# Patient Record
Sex: Male | Born: 1980 | Race: White | Hispanic: No | Marital: Married | Smoking: Never smoker
Health system: Southern US, Community
[De-identification: ages and names within clinical notes are randomized; demographics above are authoritative.]

## PROBLEM LIST (undated history)

## (undated) DIAGNOSIS — I10 Essential (primary) hypertension: Secondary | ICD-10-CM

## (undated) DIAGNOSIS — K219 Gastro-esophageal reflux disease without esophagitis: Secondary | ICD-10-CM

## (undated) HISTORY — DX: Gastro-esophageal reflux disease without esophagitis: K21.9

## (undated) HISTORY — DX: Essential (primary) hypertension: I10

---

## 2020-10-15 DIAGNOSIS — F419 Anxiety disorder, unspecified: Secondary | ICD-10-CM | POA: Diagnosis not present

## 2020-10-21 DIAGNOSIS — F419 Anxiety disorder, unspecified: Secondary | ICD-10-CM | POA: Diagnosis not present

## 2021-07-13 DIAGNOSIS — R52 Pain, unspecified: Secondary | ICD-10-CM | POA: Diagnosis not present

## 2021-07-13 DIAGNOSIS — R42 Dizziness and giddiness: Secondary | ICD-10-CM | POA: Diagnosis not present

## 2021-07-13 DIAGNOSIS — J029 Acute pharyngitis, unspecified: Secondary | ICD-10-CM | POA: Diagnosis not present

## 2021-07-13 DIAGNOSIS — R110 Nausea: Secondary | ICD-10-CM | POA: Diagnosis not present

## 2021-08-02 DIAGNOSIS — R059 Cough, unspecified: Secondary | ICD-10-CM | POA: Diagnosis not present

## 2021-08-02 DIAGNOSIS — J029 Acute pharyngitis, unspecified: Secondary | ICD-10-CM | POA: Diagnosis not present

## 2021-08-02 DIAGNOSIS — R519 Headache, unspecified: Secondary | ICD-10-CM | POA: Diagnosis not present

## 2021-08-02 DIAGNOSIS — J329 Chronic sinusitis, unspecified: Secondary | ICD-10-CM | POA: Diagnosis not present

## 2021-08-20 DIAGNOSIS — G44209 Tension-type headache, unspecified, not intractable: Secondary | ICD-10-CM | POA: Diagnosis not present

## 2021-08-20 DIAGNOSIS — R42 Dizziness and giddiness: Secondary | ICD-10-CM | POA: Diagnosis not present

## 2021-08-20 DIAGNOSIS — I10 Essential (primary) hypertension: Secondary | ICD-10-CM | POA: Diagnosis not present

## 2021-08-20 DIAGNOSIS — K2100 Gastro-esophageal reflux disease with esophagitis, without bleeding: Secondary | ICD-10-CM | POA: Diagnosis not present

## 2021-09-09 ENCOUNTER — Encounter: Payer: Self-pay | Admitting: Family Medicine

## 2021-09-09 ENCOUNTER — Ambulatory Visit (INDEPENDENT_AMBULATORY_CARE_PROVIDER_SITE_OTHER): Payer: BC Managed Care – PPO | Admitting: Family Medicine

## 2021-09-09 VITALS — BP 127/80 | HR 78 | Temp 97.7°F | Wt 293.4 lb

## 2021-09-09 DIAGNOSIS — Z029 Encounter for administrative examinations, unspecified: Secondary | ICD-10-CM

## 2021-09-09 DIAGNOSIS — K2100 Gastro-esophageal reflux disease with esophagitis, without bleeding: Secondary | ICD-10-CM

## 2021-09-09 DIAGNOSIS — I10 Essential (primary) hypertension: Secondary | ICD-10-CM

## 2021-09-09 DIAGNOSIS — F321 Major depressive disorder, single episode, moderate: Secondary | ICD-10-CM | POA: Diagnosis not present

## 2021-09-09 MED ORDER — OMEPRAZOLE 20 MG PO CPDR: 20.0000 mg | DELAYED_RELEASE_CAPSULE | Freq: Every day | ORAL | 3 refills | Status: DC

## 2021-09-09 MED ORDER — DULOXETINE HCL 30 MG PO CPEP: 30.0000 mg | ORAL_CAPSULE | Freq: Every day | ORAL | 0 refills | Status: DC

## 2021-09-09 MED ORDER — AMLODIPINE BESYLATE 5 MG PO TABS: 5.0000 mg | ORAL_TABLET | Freq: Every day | ORAL | 3 refills | Status: AC

## 2021-09-09 NOTE — Patient Instructions (Signed)
Your provider wants you to schedule an appointment with a Psychologist/Psychiatrist. The following list of offices requires the patient to call and make their own appointment, as there is information they need that only you can provide. Please feel free to choose form the following providers:  [facility]   [phone] Crisis Recovery in [place] [phone]  [facility]  [phone]   [address] [place], [place]  (Scheduled through [facility]) Must call and do an interview for appointment. Sees Children / Accepts Medicaid  [facility]    [phone]  [address], [address]    [place], [place]       [facility]  [phone] [address] [place], [place]  Evaluates for Autism but does not treat it Sees Children / Accepts Medicaid  [facility]    [phone] [address], [address]   [place], [place] Medication management, substance abuse, bipolar, grief, family, marriage, OCD, anxiety, PTSD Sees children / Accepts Medicaid  [facility]    [phone] [address] [place], [place] Sees children / Accepts Medicaid  [facility]  [phone] [address] [place], [place]   Dr Akinlayo     [phone] [address] [place], [place]  Sees ADD & ADHD for treatment Accepts Medicaid  [facility]  [phone] [place], [place] Evaluates for Autism Accepts Medicaid  [facility]  [phone] [address]  [place], [place]  Does Adult ADD evaluations Does not accept Medicaid  [facility]   [phone] [address]   [place], [place] Uses animal therapy  Sees children as young as 3 years old Accepts Medicaid  [facility]     [phone]    [address]  , [place] [address] Sees children Accepts Medicaid  

## 2021-09-09 NOTE — Progress Notes (Addendum)
Subjective:  Patient ID: Joshua Williamson, male    DOB: 1981/06/08  Age: 41 y.o. MRN: [id number]  CC: Establish Care   HPI Joshua Williamson presents for new patient visit. Moved from [facility] recently.   Doesn't want to go anywhere or do anything. Now, wife says, he is just a shell.   Recent onset of htN.  "I've never been so depressed." Just wants to lay there. Started missing work on 1/11. Sits and thinks about everything now. Doesn't seem to care. [place].   Depression screen [facility] 2/9 09/09/2021 09/09/2021  Decreased Interest 3 0  Down, Depressed, Hopeless 3 0  PHQ - 2 Score 6 0  Altered sleeping 3 -  Tired, decreased energy 3 -  Change in appetite 3 -  Feeling bad or failure about yourself  2 -  Trouble concentrating 3 -  Moving slowly or fidgety/restless 1 -  Suicidal thoughts 0 -  PHQ-9 Score 21 -  Difficult doing work/chores Extremely dIfficult -   GAD 7 : Generalized Anxiety Score 09/09/2021  Nervous, Anxious, on Edge 3  Control/stop worrying 3  Worry too much - different things 3  Trouble relaxing 3  Restless 2  Easily annoyed or irritable 3  Afraid - awful might happen 2  Total GAD 7 Score 19  Anxiety Difficulty Extremely difficult     History Joshua Williamson has a past medical history of GERD (gastroesophageal reflux disease) and Hypertension.   He has no past surgical history on file.   His family history includes Arthritis in his mother; Diabetes in his father.He reports that he has never smoked. He has been exposed to tobacco smoke. He has never used smokeless tobacco. He reports that he does not currently use alcohol. He reports that he does not use drugs.    ROS Review of Systems  Constitutional: Negative.   HENT: Negative.    Eyes:  Negative for visual disturbance.  Respiratory:  Negative for cough and shortness of breath.   Cardiovascular:  Negative for chest pain and leg swelling.  Gastrointestinal:  Positive for abdominal pain (heartburn  with reflux retrosternal). Negative for diarrhea, nausea and vomiting.  Genitourinary:  Negative for difficulty urinating.  Musculoskeletal:  Negative for arthralgias and myalgias.  Skin:  Negative for rash.  Neurological:  Negative for headaches.  Psychiatric/Behavioral:  Negative for sleep disturbance.    Objective:  BP 127/80    Pulse 78    Temp 97.7 F (36.5 C)    Wt 293 lb 6.4 oz (133.1 kg)    SpO2 98%   BP Readings from Last 3 Encounters:  09/09/21 127/80    Wt Readings from Last 3 Encounters:  09/09/21 293 lb 6.4 oz (133.1 kg)     Physical Exam Constitutional:      General: He is not in acute distress.    Appearance: He is well-developed.  HENT:     Head: Normocephalic and atraumatic.     Right Ear: External ear normal.     Left Ear: External ear normal.     Nose: Nose normal.  Eyes:     Conjunctiva/sclera: Conjunctivae normal.     Pupils: Pupils are equal, round, and reactive to light.  Cardiovascular:     Rate and Rhythm: Normal rate and regular rhythm.     Heart sounds: Normal heart sounds. No murmur heard. Pulmonary:     Effort: Pulmonary effort is normal. No respiratory distress.     Breath sounds: Normal breath sounds. No  wheezing or rales.  Abdominal:     Palpations: Abdomen is soft.     Tenderness: There is no abdominal tenderness.  Musculoskeletal:        General: Normal range of motion.     Cervical back: Normal range of motion and neck supple.  Skin:    General: Skin is warm and dry.  Neurological:     Mental Status: He is alert and oriented to person, place, and time.     Deep Tendon Reflexes: Reflexes are normal and symmetric.  Psychiatric:        Behavior: Behavior normal.        Thought Content: Thought content normal.        Judgment: Judgment normal.      Assessment & Plan:   Joshua Williamson was seen today for establish care.  Diagnoses and all orders for this visit:  Depression, major, single episode, moderate ([place])  Essential  hypertension  Other orders -     DULoxetine (CYMBALTA) 30 MG capsule; Take 1 capsule (30 mg total) by mouth daily. For one week then two daily. Take with a full stomach at suppertime -     amLODipine (NORVASC) 5 MG tablet; Take 1 tablet (5 mg total) by mouth daily. -     omeprazole (PRILOSEC) 20 MG capsule; Take 1 capsule (20 mg total) by mouth daily.       I have discontinued Joshua Williamson "[name]"'s esomeprazole. I have also changed his amLODipine and omeprazole. Additionally, I am having him start on DULoxetine.  Allergies as of 09/09/2021   No Known Allergies      Medication List        Accurate as of September 09, 2021 11:59 PM. If you have any questions, ask your nurse or doctor.          STOP taking these medications    esomeprazole 40 MG capsule Commonly known as: [place] by: Claretta Fraise, MD       TAKE these medications    amLODipine 5 MG tablet Commonly known as: NORVASC Take 1 tablet (5 mg total) by mouth daily.   DULoxetine 30 MG capsule Commonly known as: Cymbalta Take 1 capsule (30 mg total) by mouth daily. For one week then two daily. Take with a full stomach at suppertime Started by: Claretta Fraise, MD   omeprazole 20 MG capsule Commonly known as: PRILOSEC Take 1 capsule (20 mg total) by mouth daily.         Follow-up: Return in about 2 weeks (around 09/23/2021) for Depression, Anxiety.  Claretta Fraise, M.D.

## 2021-09-16 DIAGNOSIS — I10 Essential (primary) hypertension: Secondary | ICD-10-CM | POA: Insufficient documentation

## 2021-09-16 DIAGNOSIS — K2100 Gastro-esophageal reflux disease with esophagitis, without bleeding: Secondary | ICD-10-CM | POA: Insufficient documentation

## 2021-09-16 DIAGNOSIS — F321 Major depressive disorder, single episode, moderate: Secondary | ICD-10-CM | POA: Insufficient documentation

## 2021-09-27 ENCOUNTER — Ambulatory Visit: Payer: BC Managed Care – PPO | Admitting: Family Medicine

## 2021-09-27 ENCOUNTER — Telehealth: Payer: Self-pay | Admitting: Family Medicine

## 2021-10-05 ENCOUNTER — Telehealth: Payer: Self-pay | Admitting: Family Medicine

## 2021-10-05 NOTE — Telephone Encounter (Signed)
Pt called and appt made for 2/23 with [facility] - this is for follow up of new meds that was started at NEW pt appt

## 2021-10-07 ENCOUNTER — Ambulatory Visit: Payer: BC Managed Care – PPO | Admitting: Family Medicine

## 2021-10-07 ENCOUNTER — Encounter: Payer: Self-pay | Admitting: Family Medicine

## 2021-10-07 VITALS — BP 119/69 | HR 75 | Temp 97.5°F | Ht 74.0 in | Wt 297.2 lb

## 2021-10-07 DIAGNOSIS — F321 Major depressive disorder, single episode, moderate: Secondary | ICD-10-CM | POA: Diagnosis not present

## 2021-10-07 MED ORDER — DULOXETINE HCL 60 MG PO CPEP: 120.0000 mg | ORAL_CAPSULE | Freq: Every day | ORAL | 2 refills | Status: DC

## 2021-10-07 NOTE — Progress Notes (Signed)
Subjective:  Patient ID: Joshua Williamson, male    DOB: 05/22/1981  Age: 41 y.o. MRN: [id number]  CC: Follow-up and Depression   HPI Joshua Williamson presents for feeling much better. "80%" Still easily agitated.  Depression screen [facility] 2/9 10/07/2021 09/09/2021 09/09/2021  Decreased Interest 1 3 0  Down, Depressed, Hopeless 1 3 0  PHQ - 2 Score 2 6 0  Altered sleeping 1 3 -  Tired, decreased energy 1 3 -  Change in appetite 1 3 -  Feeling bad or failure about yourself  0 2 -  Trouble concentrating 1 3 -  Moving slowly or fidgety/restless 0 1 -  Suicidal thoughts 0 0 -  PHQ-9 Score 6 21 -  Difficult doing work/chores Somewhat difficult Extremely dIfficult -   GAD 7 : Generalized Anxiety Score 10/07/2021 09/09/2021  Nervous, Anxious, on Edge 2 3  Control/stop worrying 2 3  Worry too much - different things 2 3  Trouble relaxing 1 3  Restless 0 2  Easily annoyed or irritable 1 3  Afraid - awful might happen 0 2  Total GAD 7 Score 8 19  Anxiety Difficulty Somewhat difficult Extremely difficult   WAkes up with random days of worrying. Feeling like he might lose everything.    History Add has a past medical history of GERD (gastroesophageal reflux disease) and Hypertension.   He has no past surgical history on file.   His family history includes Arthritis in his mother; Diabetes in his father.He reports that he has never smoked. He has been exposed to tobacco smoke. He has never used smokeless tobacco. He reports that he does not currently use alcohol. He reports that he does not use drugs.    ROS Review of Systems  Constitutional:  Negative for activity change.   Objective:  BP 119/69    Pulse 75    Temp (!) 97.5 F (36.4 C)    Ht 6\' 2"  (1.88 m)    Wt 297 lb 3.2 oz (134.8 kg)    SpO2 94%    BMI 38.16 kg/m   BP Readings from Last 3 Encounters:  10/07/21 119/69  09/09/21 127/80    Wt Readings from Last 3 Encounters:  10/07/21 297 lb 3.2 oz (134.8 kg)   09/09/21 293 lb 6.4 oz (133.1 kg)     Physical Exam Vitals reviewed.  Constitutional:      Appearance: He is well-developed.  HENT:     Head: Normocephalic and atraumatic.     Right Ear: External ear normal.     Left Ear: External ear normal.     Mouth/Throat:     Pharynx: No oropharyngeal exudate or posterior oropharyngeal erythema.  Eyes:     Pupils: Pupils are equal, round, and reactive to light.  Cardiovascular:     Rate and Rhythm: Normal rate and regular rhythm.     Heart sounds: No murmur heard. Pulmonary:     Effort: No respiratory distress.     Breath sounds: Normal breath sounds.  Musculoskeletal:     Cervical back: Normal range of motion and neck supple.  Neurological:     Mental Status: He is alert and oriented to person, place, and time.      Assessment & Plan:   Joshua Williamson was seen today for follow-up and depression.  Diagnoses and all orders for this visit:  Depression, major, single episode, moderate ([place])  Other orders -     DULoxetine (CYMBALTA) 60 MG capsule; Take 2  capsules (120 mg total) by mouth daily.       I have changed Joshua Williamson "[name]"'s DULoxetine. I am also having him maintain his amLODipine and omeprazole.  Allergies as of 10/07/2021   No Known Allergies      Medication List        Accurate as of October 07, 2021  4:56 PM. If you have any questions, ask your nurse or doctor.          amLODipine 5 MG tablet Commonly known as: NORVASC Take 1 tablet (5 mg total) by mouth daily.   DULoxetine 60 MG capsule Commonly known as: Cymbalta Take 2 capsules (120 mg total) by mouth daily. What changed:  medication strength how much to take additional instructions Changed by: Claretta Fraise, MD   omeprazole 20 MG capsule Commonly known as: PRILOSEC Take 1 capsule (20 mg total) by mouth daily.         Follow-up: Return in about 6 weeks (around 11/18/2021).  Claretta Fraise, M.D.

## 2021-10-07 NOTE — Patient Instructions (Signed)
Your provider wants you to schedule an appointment with a Psychologist/Psychiatrist. The following list of offices requires the patient to call and make their own appointment, as there is information they need that only you can provide. Please feel free to choose form the following providers:  [facility]   [phone] Crisis Recovery in [place] [phone]  [facility]  [phone]   [address] [place], [place]  (Scheduled through [facility]) Must call and do an interview for appointment. Sees Children / Accepts Medicaid  [facility]    [phone]  [address]    [place], [place]       [facility]  [phone] [address] [place], [place]  Evaluates for Autism but does not treat it Sees Children / Accepts Medicaid  [facility]    [phone] [address], [address]   [place], [place] Medication management, substance abuse, bipolar, grief, family, marriage, OCD, anxiety, PTSD Sees children / Accepts Medicaid  [facility]    [phone] [address] [place], [place] Sees children / Accepts Medicaid  [facility]  [phone] [address] [place], [place]   Dr Akinlayo     [phone] [address] [place], [place]  Sees ADD & ADHD for treatment Accepts Medicaid  [facility]  [phone] [address] [place], [place] Evaluates for Autism Accepts Medicaid  [facility]  [phone] [address]  [place], [place]  Does Adult ADD evaluations Does not accept Medicaid  [facility]   [phone] [address]   [place], [place] Uses animal therapy  Sees children as young as 3 years old Accepts Medicaid  [facility]     [phone]    [address]  [place], [place] [address] Sees children Accepts Medicaid  

## 2021-11-22 ENCOUNTER — Telehealth: Payer: Self-pay | Admitting: *Deleted

## 2021-11-22 ENCOUNTER — Ambulatory Visit (INDEPENDENT_AMBULATORY_CARE_PROVIDER_SITE_OTHER): Payer: BC Managed Care – PPO | Admitting: Family Medicine

## 2021-11-22 ENCOUNTER — Encounter: Payer: Self-pay | Admitting: Family Medicine

## 2021-11-22 VITALS — BP 129/75 | HR 103 | Temp 98.0°F | Ht 74.0 in | Wt 295.8 lb

## 2021-11-22 DIAGNOSIS — F321 Major depressive disorder, single episode, moderate: Secondary | ICD-10-CM | POA: Diagnosis not present

## 2021-11-22 DIAGNOSIS — R197 Diarrhea, unspecified: Secondary | ICD-10-CM

## 2021-11-22 DIAGNOSIS — R1011 Right upper quadrant pain: Secondary | ICD-10-CM

## 2021-11-22 MED ORDER — DESVENLAFAXINE SUCCINATE ER 50 MG PO TB24: 50.0000 mg | ORAL_TABLET | Freq: Every day | ORAL | 2 refills | Status: DC

## 2021-11-22 NOTE — Progress Notes (Signed)
? ?Subjective:  ?Patient ID: Joshua Williamson, male    DOB: 1981/03/31  Age: 41 y.o. MRN: [id number] ? ?CC: Depression ? ? ?HPI ?Joshua Williamson presents for still having depression symptoms. Missing work due to diarrhea. The financial stress in turn made the depression worse. Decreased the cymbalta. Still having 3-5 LBM a day. No change of diet. No alcohol.  ? ? ?  11/22/2021  ?  4:07 PM 10/07/2021  ?  4:26 PM 09/09/2021  ? 11:01 AM  ?Depression screen PHQ 2/9  ?Decreased Interest 1 1 3   ?Down, Depressed, Hopeless 2 1 3   ?PHQ - 2 Score 3 2 6   ?Altered sleeping 2 1 3   ?Tired, decreased energy 1 1 3   ?Change in appetite 2 1 3   ?Feeling bad or failure about yourself  2 0 2  ?Trouble concentrating 1 1 3   ?Moving slowly or fidgety/restless 0 0 1  ?Suicidal thoughts 0 0 0  ?PHQ-9 Score 11 6 21   ?Difficult doing work/chores Very difficult Somewhat difficult Extremely dIfficult  ? ? ?History ?Joshua Williamson has a past medical history of GERD (gastroesophageal reflux disease) and Hypertension.  ? ?He has no past surgical history on file.  ? ?His family history includes Arthritis in his mother; Diabetes in his father.He reports that he has never smoked. He has been exposed to tobacco smoke. He has never used smokeless tobacco. He reports that he does not currently use alcohol. He reports that he does not use drugs. ? ? ? ?ROS ?Review of Systems  ?Constitutional:  Negative for fever.  ?Respiratory:  Negative for shortness of breath.   ?Cardiovascular:  Negative for chest pain.  ?Musculoskeletal:  Negative for arthralgias.  ?Skin:  Negative for rash.  ? ?Objective:  ?BP 129/75   Pulse (!) 103   Temp 98 ?F (36.7 ?C)   Ht 6\' 2"  (1.88 m)   Wt 295 lb 12.8 oz (134.2 kg)   SpO2 97%   BMI 37.98 kg/m?  ? ?BP Readings from Last 3 Encounters:  ?11/22/21 129/75  ?10/07/21 119/69  ?09/09/21 127/80  ? ? ?Wt Readings from Last 3 Encounters:  ?11/22/21 295 lb 12.8 oz (134.2 kg)  ?10/07/21 297 lb 3.2 oz (134.8 kg)  ?09/09/21 293 lb 6.4 oz  (133.1 kg)  ? ? ? ?Physical Exam ?Vitals reviewed.  ?Constitutional:   ?   Appearance: He is well-developed.  ?HENT:  ?   Head: Normocephalic and atraumatic.  ?   Right Ear: External ear normal.  ?   Left Ear: External ear normal.  ?   Mouth/Throat:  ?   Pharynx: No oropharyngeal exudate or posterior oropharyngeal erythema.  ?Eyes:  ?   Pupils: Pupils are equal, round, and reactive to light.  ?Cardiovascular:  ?   Rate and Rhythm: Normal rate and regular rhythm.  ?   Heart sounds: No murmur heard. ?Pulmonary:  ?   Effort: No respiratory distress.  ?   Breath sounds: Normal breath sounds.  ?Abdominal:  ?   General: Bowel sounds are normal.  ?   Palpations: Abdomen is soft.  ?   Tenderness: There is abdominal tenderness (RUQ). Positive signs include Murphy's sign.  ?Musculoskeletal:  ?   Cervical back: Normal range of motion and neck supple.  ?Neurological:  ?   Mental Status: He is alert and oriented to person, place, and time.  ? ? ? ? ?Assessment & Plan:  ? ?Joshua Williamson was seen today for depression. ? ?Diagnoses and all orders for  this visit: ? ?Diarrhea, unspecified type ?-     US Abdomen Complete; Future ? ?RUQ pain ?-     US Abdomen Complete; Future ? ?Depression, major, single episode, moderate ([facility]) ? ?Other orders ?-     desvenlafaxine (PRISTIQ) 50 MG 24 hr tablet; Take 1 tablet (50 mg total) by mouth daily. ? ? ? ? ? ? ?I have discontinued Joshua Williamson "Joshua Williamson"'s DULoxetine. I am also having him start on desvenlafaxine. Additionally, I am having him maintain his amLODipine and omeprazole. ? ?Allergies as of 11/22/2021   ? ?   Reactions  ? Cymbalta [duloxetine Hcl] Diarrhea  ? ?  ? ?  ?Medication List  ?  ? ?  ? Accurate as of November 22, 2021  5:04 PM. If you have any questions, ask your nurse or doctor.  ?  ?  ? ?  ? ?STOP taking these medications   ? ?DULoxetine 60 MG capsule ?Commonly known as: Cymbalta ?Stopped by: Mechele Claude, MD ?  ? ?  ? ?TAKE these medications   ? ?amLODipine 5 MG tablet ?Commonly  known as: NORVASC ?Take 1 tablet (5 mg total) by mouth daily. ?  ?desvenlafaxine 50 MG 24 hr tablet ?Commonly known as: Pristiq ?Take 1 tablet (50 mg total) by mouth daily. ?Started by: Mechele Claude, MD ?  ?omeprazole 20 MG capsule ?Commonly known as: PRILOSEC ?Take 1 capsule (20 mg total) by mouth daily. ?  ? ?  ? ? ? ?Follow-up: Return in about 1 month (around 12/22/2021). ? ?Mechele Claude, M.D. ?

## 2021-11-22 NOTE — Telephone Encounter (Signed)
Key: [id number] - PA Case ID: [id number] - Rx #: [id number] ? ?Drug ? ?Desvenlafaxine Succinate ER 50MG  er tablets  ?Form ? ?[employer] Electronic PA Form [phone] NCPDP) ? ? ?Original Claim Info ? ?[phone] MUST MEET STEP, [place] REQR [id number] REQUIRES PRIOR AUTHORIZATION(PHARMACY HELP DESK [id number])  ?Health Plan?s Preferred Products ?DULOXETINE CAP 60MG  07-09-1995  ?VENLAFAXINE CAP 75MG  ER  ? ?Sent to Plan today  ? ?

## 2021-11-23 NOTE — Telephone Encounter (Signed)
Dear Joshua Williamson: ?We?re pleased to let you know that we?ve approved your or your doctor?s request for coverage ?for Desvenlafaxine Succinate ER 50MG  OR TB24. You can now fill your prescription, and it will ?be covered according to your plan. ?As long as you remain covered by your prescription drug plan and there are no changes to your ?plan benefits, this request is approved from 11/23/2021 to 11/24/2022. When this approval ?expires, please speak to your doctor about your treatment. ? ?Pharmacy aware.  ? ?

## 2021-11-29 ENCOUNTER — Ambulatory Visit (HOSPITAL_COMMUNITY)
Admission: RE | Admit: 2021-11-29 | Discharge: 2021-11-29 | Disposition: A | Discharge status: Home / Self Care | Payer: BC Managed Care – PPO | Source: Ambulatory Visit | Attending: Family Medicine | Admitting: Family Medicine

## 2021-11-29 DIAGNOSIS — R1011 Right upper quadrant pain: Secondary | ICD-10-CM | POA: Insufficient documentation

## 2021-11-29 DIAGNOSIS — R197 Diarrhea, unspecified: Secondary | ICD-10-CM | POA: Insufficient documentation

## 2021-11-29 DIAGNOSIS — K7689 Other specified diseases of liver: Secondary | ICD-10-CM | POA: Diagnosis not present

## 2021-12-28 ENCOUNTER — Ambulatory Visit (INDEPENDENT_AMBULATORY_CARE_PROVIDER_SITE_OTHER): Payer: BC Managed Care – PPO | Admitting: Family Medicine

## 2021-12-28 ENCOUNTER — Encounter: Payer: Self-pay | Admitting: Family Medicine

## 2021-12-28 VITALS — BP 117/81 | HR 75 | Temp 97.5°F | Ht 74.0 in | Wt 293.8 lb

## 2021-12-28 DIAGNOSIS — I714 Abdominal aortic aneurysm, without rupture, unspecified: Secondary | ICD-10-CM | POA: Diagnosis not present

## 2021-12-28 DIAGNOSIS — I10 Essential (primary) hypertension: Secondary | ICD-10-CM | POA: Diagnosis not present

## 2021-12-28 DIAGNOSIS — F321 Major depressive disorder, single episode, moderate: Secondary | ICD-10-CM | POA: Diagnosis not present

## 2021-12-28 DIAGNOSIS — T50905D Adverse effect of unspecified drugs, medicaments and biological substances, subsequent encounter: Secondary | ICD-10-CM

## 2021-12-28 DIAGNOSIS — K2100 Gastro-esophageal reflux disease with esophagitis, without bleeding: Secondary | ICD-10-CM | POA: Diagnosis not present

## 2021-12-28 DIAGNOSIS — K760 Fatty (change of) liver, not elsewhere classified: Secondary | ICD-10-CM

## 2021-12-28 NOTE — Progress Notes (Addendum)
Subjective:  Patient ID: Joshua Williamson, male    DOB: 1980/10/22  Age: 41 y.o. MRN: [id number]  CC: Follow-up and Depression   HPI Joshua Williamson presents for recheck of depression. Diarrhea resolved with switch to Pristiq from cymbalta. Less worried about wife, but got a lot of worrisome feedback from friends about the aneurysm.  As result his depression has not remitted is much as he hoped for.  He realizes that the trigger was the worry about the aneurysm and after discussion today regarding the aneurysm he is feels much better about that.  He is not overly concerned about the fatty infiltration of the liver.  We discussed that as well.  PHQ noted below.  He reports that his diarrhea has remitted.  He is gone from 10-15 bowel movements daily down to 1-2 bowel movements a day.  It took a little over a week for the change to take place when he switched from the Cymbalta  Patient in for follow-up of GERD. Currently asymptomatic taking  PPI daily. There is no chest pain or heartburn. No hematemesis and no melena. No dysphagia or choking. Onset is remote. Progression is stable. Complicating factors, none.      12/28/2021   11:08 AM 12/28/2021   11:04 AM 11/22/2021    4:07 PM  Depression screen PHQ 2/9  Decreased Interest 1 0 1  Down, Depressed, Hopeless 2 0 2  PHQ - 2 Score 3 0 3  Altered sleeping 2  2  Tired, decreased energy 1  1  Change in appetite 1  2  Feeling bad or failure about yourself  0  2  Trouble concentrating 0  1  Moving slowly or fidgety/restless 0  0  Suicidal thoughts 0  0  PHQ-9 Score 7  11  Difficult doing work/chores Somewhat difficult  Very difficult    History Joshua Williamson has a past medical history of GERD (gastroesophageal reflux disease) and Hypertension.   He has no past surgical history on file.   His family history includes Arthritis in his mother; Diabetes in his father.He reports that he has never smoked. He has been exposed to tobacco smoke.  He has never used smokeless tobacco. He reports that he does not currently use alcohol. He reports that he does not use drugs.    ROS Review of Systems  Constitutional:  Negative for fever.  Respiratory:  Negative for shortness of breath.   Cardiovascular:  Negative for chest pain.  Musculoskeletal:  Negative for arthralgias.  Skin:  Negative for rash.   Objective:  BP 117/81   Pulse 75   Temp (!) 97.5 F (36.4 C)   Ht 6\' 2"  (1.88 m)   Wt 293 lb 12.8 oz (133.3 kg)   SpO2 97%   BMI 37.72 kg/m   BP Readings from Last 3 Encounters:  12/28/21 117/81  11/22/21 129/75  10/07/21 119/69    Wt Readings from Last 3 Encounters:  12/28/21 293 lb 12.8 oz (133.3 kg)  11/22/21 295 lb 12.8 oz (134.2 kg)  10/07/21 297 lb 3.2 oz (134.8 kg)     Physical Exam Vitals reviewed.  Constitutional:      Appearance: He is well-developed.  HENT:     Head: Normocephalic and atraumatic.     Right Ear: External ear normal.     Left Ear: External ear normal.     Mouth/Throat:     Pharynx: No oropharyngeal exudate or posterior oropharyngeal erythema.  Eyes:  Pupils: Pupils are equal, round, and reactive to light.  Cardiovascular:     Rate and Rhythm: Normal rate and regular rhythm.     Heart sounds: No murmur heard. Pulmonary:     Effort: No respiratory distress.     Breath sounds: Normal breath sounds.  Musculoskeletal:     Cervical back: Normal range of motion and neck supple.  Neurological:     Mental Status: He is alert and oriented to person, place, and time.      Assessment & Plan:   Joshua Williamson was seen today for follow-up and depression.  Diagnoses and all orders for this visit:  Depression, major, single episode, moderate (HCC)  Essential hypertension  Gastroesophageal reflux disease with esophagitis without hemorrhage  Abdominal aortic aneurysm (AAA) without rupture, unspecified part ([facility])  Nonalcoholic hepatosteatosis  Adverse effect of drug, subsequent  encounter       I am having Joshua Williamson "Joshua Williamson" maintain his amLODipine, omeprazole, and desvenlafaxine.  Allergies as of 12/28/2021       Reactions   Cymbalta [duloxetine Hcl] Diarrhea        Medication List        Accurate as of Dec 28, 2021 11:59 PM. If you have any questions, ask your nurse or doctor.          amLODipine 5 MG tablet Commonly known as: NORVASC Take 1 tablet (5 mg total) by mouth daily.   desvenlafaxine 50 MG 24 hr tablet Commonly known as: Pristiq Take 1 tablet (50 mg total) by mouth daily.   omeprazole 20 MG capsule Commonly known as: PRILOSEC Take 1 capsule (20 mg total) by mouth daily.       Mr. Joshua Williamson was reassured that his aneurysm was not of a size that was worrisome at this time.  Of course it does need to be followed.  But his risk at this time is minimal unless it enlarges.  It could enlarge soon but it may never enlarge.  And as long as we keep an eye on it once he gets to 5.5 cm it can be treated with a catheter procedure with a coil being placed.  He is much less worried now about this after we discussed it.  He realizes that his main health concern is his weight and is going to work on taking off about 65 pounds.  He does not want to increase the Pristiq for now.  He would like to work on reducing his worry and work on weight loss.  Follow-up: Return in about 2 months (around 02/27/2022) for Depression.  Mechele Claude, M.D.

## 2022-03-03 ENCOUNTER — Other Ambulatory Visit: Payer: Self-pay | Admitting: Family Medicine

## 2022-03-07 ENCOUNTER — Encounter: Payer: Self-pay | Admitting: Family Medicine

## 2022-03-07 ENCOUNTER — Ambulatory Visit: Payer: BC Managed Care – PPO | Admitting: Family Medicine

## 2022-03-07 VITALS — BP 106/70 | Temp 97.9°F | Ht 74.0 in | Wt 297.6 lb

## 2022-03-07 DIAGNOSIS — I10 Essential (primary) hypertension: Secondary | ICD-10-CM

## 2022-03-07 DIAGNOSIS — F321 Major depressive disorder, single episode, moderate: Secondary | ICD-10-CM | POA: Diagnosis not present

## 2022-03-07 MED ORDER — DESVENLAFAXINE SUCCINATE ER 100 MG PO TB24: 100.0000 mg | ORAL_TABLET | Freq: Every day | ORAL | 1 refills | Status: AC

## 2022-03-07 NOTE — Progress Notes (Signed)
Subjective:  Patient ID: Joshua Williamson, male    DOB: Jul 30, 1981  Age: 41 y.o. MRN: [id number]  CC: Follow-up and Depression   HPI Joshua Williamson presents for edgy. Irritable. Got into it with supervisor 4 times the other day. Having trouble with staying on a diet. Drinking a lot of soda. Drinking a 2 liter daily.   Patient in for follow-up of GERD. Currently asymptomatic taking  PPI daily. There is no chest pain or heartburn. No hematemesis and no melena. No dysphagia or choking. Onset is remote. Progression is stable. Complicating factors, none.      03/07/2022    2:51 PM 03/07/2022    2:47 PM 12/28/2021   11:08 AM 12/28/2021   11:04 AM 11/22/2021    4:07 PM  Depression screen PHQ 2/9  Decreased Interest 1 0 1 0 1  Down, Depressed, Hopeless 2 0 2 0 2  PHQ - 2 Score 3 0 3 0 3  Altered sleeping 2  2  2   Tired, decreased energy 2  1  1   Change in appetite 0  1  2  Feeling bad or failure about yourself  1  0  2  Trouble concentrating 1  0  1  Moving slowly or fidgety/restless 1  0  0  Suicidal thoughts 0  0  0  PHQ-9 Score 10  7  11   Difficult doing work/chores Very difficult  Somewhat difficult  Very difficult        03/07/2022    2:51 PM 03/07/2022    2:47 PM 12/28/2021   11:08 AM  Depression screen PHQ 2/9  Decreased Interest 1 0 1  Down, Depressed, Hopeless 2 0 2  PHQ - 2 Score 3 0 3  Altered sleeping 2  2  Tired, decreased energy 2  1  Change in appetite 0  1  Feeling bad or failure about yourself  1  0  Trouble concentrating 1  0  Moving slowly or fidgety/restless 1  0  Suicidal thoughts 0  0  PHQ-9 Score 10  7  Difficult doing work/chores Very difficult  Somewhat difficult    History Joshua Williamson has a past medical history of GERD (gastroesophageal reflux disease) and Hypertension.   He has no past surgical history on file.   His family history includes Arthritis in his mother; Diabetes in his father.He reports that he has never smoked. He has been  exposed to tobacco smoke. He has never used smokeless tobacco. He reports that he does not currently use alcohol. He reports that he does not use drugs.    ROS Review of Systems  Constitutional:  Negative for fever.  Respiratory:  Negative for shortness of breath.   Cardiovascular:  Negative for chest pain.  Musculoskeletal:  Negative for arthralgias.  Skin:  Negative for rash.    Objective:  BP 106/70   Temp 97.9 F (36.6 C)   Ht 6' 2"  (1.88 m)   Wt 297 lb 9.6 oz (135 kg)   BMI 38.21 kg/m   BP Readings from Last 3 Encounters:  03/07/22 106/70  12/28/21 117/81  11/22/21 129/75    Wt Readings from Last 3 Encounters:  03/07/22 297 lb 9.6 oz (135 kg)  12/28/21 293 lb 12.8 oz (133.3 kg)  11/22/21 295 lb 12.8 oz (134.2 kg)     Physical Exam    Assessment & Plan:   Adis was seen today for follow-up and depression.  Diagnoses and all orders for this  visit:  Depression, major, single episode, moderate (HCC) -     CMP14+EGFR  Essential hypertension -     CMP14+EGFR  Other orders -     desvenlafaxine (PRISTIQ) 100 MG 24 hr tablet; Take 1 tablet (100 mg total) by mouth daily.       I have changed Joshua Williamson "[name]"'s desvenlafaxine. I am also having him maintain his amLODipine and omeprazole.  Allergies as of 03/07/2022       Reactions   Cymbalta [duloxetine Hcl] Diarrhea        Medication List        Accurate as of March 07, 2022  3:24 PM. If you have any questions, ask your nurse or doctor.          amLODipine 5 MG tablet Commonly known as: NORVASC Take 1 tablet (5 mg total) by mouth daily.   desvenlafaxine 100 MG 24 hr tablet Commonly known as: PRISTIQ Take 1 tablet (100 mg total) by mouth daily. What changed:  medication strength how much to take Changed by: Claretta Fraise, MD   omeprazole 20 MG capsule Commonly known as: PRILOSEC Take 1 capsule (20 mg total) by mouth daily.         Follow-up: Return in about 6  weeks (around 04/18/2022).  Claretta Fraise, M.D.

## 2022-03-08 LAB — CMP14+EGFR
ALT: 17 IU/L (ref 0–44)
AST: 16 IU/L (ref 0–40)
Albumin/Globulin Ratio: 2 (ref 1.2–2.2)
Albumin: 4.6 g/dL (ref 4.1–5.1)
Alkaline Phosphatase: 77 IU/L (ref 44–121)
BUN/Creatinine Ratio: 15 (ref 9–20)
BUN: 17 mg/dL (ref 6–24)
Bilirubin Total: 0.4 mg/dL (ref 0.0–1.2)
CO2: 22 mmol/L (ref 20–29)
Calcium: 9.7 mg/dL (ref 8.7–10.2)
Chloride: 104 mmol/L (ref 96–106)
Creatinine, Ser: 1.14 mg/dL (ref 0.76–1.27)
Globulin, Total: 2.3 g/dL (ref 1.5–4.5)
Glucose: 79 mg/dL (ref 70–99)
Potassium: 3.8 mmol/L (ref 3.5–5.2)
Sodium: 141 mmol/L (ref 134–144)
Total Protein: 6.9 g/dL (ref 6.0–8.5)
eGFR: 83 mL/min/{1.73_m2} (ref >59)

## 2022-03-08 NOTE — Progress Notes (Signed)
Hello Dyshawn,  Your lab result is normal and/or stable.Some minor variations that are not significant are commonly marked abnormal, but do not represent any medical problem for you.  Best regards, Jermisha Hoffart, M.D.

## 2022-03-29 ENCOUNTER — Other Ambulatory Visit: Payer: Self-pay | Admitting: Family Medicine

## 2022-04-20 ENCOUNTER — Encounter: Payer: Self-pay | Admitting: Family Medicine

## 2022-04-20 ENCOUNTER — Ambulatory Visit (INDEPENDENT_AMBULATORY_CARE_PROVIDER_SITE_OTHER): Payer: BC Managed Care – PPO | Admitting: Family Medicine

## 2022-04-20 VITALS — BP 124/80 | HR 74 | Temp 98.0°F | Ht 74.0 in | Wt 292.4 lb

## 2022-04-20 DIAGNOSIS — F321 Major depressive disorder, single episode, moderate: Secondary | ICD-10-CM

## 2022-04-20 DIAGNOSIS — I10 Essential (primary) hypertension: Secondary | ICD-10-CM | POA: Diagnosis not present

## 2022-04-20 NOTE — Progress Notes (Signed)
Subjective:  Patient ID: Joshua Williamson, male    DOB: Aug 29, 1980  Age: 41 y.o. MRN: [id number]  CC: Medical Management of Chronic Issues   HPI Joshua Williamson presents for feeling less depressed. More active. Getting some exercise. 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 Set to go to training in 2-4 weeks.      04/20/2022   10:58 AM 04/20/2022   10:53 AM 03/07/2022    2:51 PM  Depression screen PHQ 2/9  Decreased Interest 0 0 1  Down, Depressed, Hopeless 0 0 2  PHQ - 2 Score 0 0 3  Altered sleeping 1  2  Tired, decreased energy 1  2  Change in appetite 1  0  Feeling bad or failure about yourself  0  1  Trouble concentrating 0  1  Moving slowly or fidgety/restless 0  1  Suicidal thoughts 0  0  PHQ-9 Score 3  10  Difficult doing work/chores Not difficult at all  Very difficult    History Joshua Williamson has a past medical history of GERD (gastroesophageal reflux disease) and Hypertension.   He has no past surgical history on file.   His family history includes Arthritis in his mother; Diabetes in his father.He reports that he has never smoked. He has been exposed to tobacco smoke. He has never used smokeless tobacco. He reports that he does not currently use alcohol. He reports that he does not use drugs.    ROS Review of Systems  Constitutional:  Negative for fever.  Respiratory:  Negative for shortness of breath.   Cardiovascular:  Negative for chest pain.  Musculoskeletal:  Negative for arthralgias.  Skin:  Negative for rash.    Objective:  BP 124/80   Pulse 74   Temp 98 F (36.7 C)   Ht 6\' 2"  (1.88 m)   Wt 292 lb 6.4 oz (132.6 kg)   SpO2 96%   BMI 37.54 kg/m   BP Readings from Last 3 Encounters:  04/20/22 124/80  03/07/22 106/70  12/28/21 117/81    Wt Readings from Last 3 Encounters:  04/20/22 292 lb 6.4 oz (132.6 kg)  03/07/22 297 lb 9.6 oz (135 kg)  12/28/21 293 lb 12.8 oz (133.3 kg)      Physical Exam Vitals reviewed.  Constitutional:      Appearance: He is well-developed.  HENT:     Head: Normocephalic and atraumatic.     Right Ear: External ear normal.     Left Ear: External ear normal.     Mouth/Throat:     Pharynx: No oropharyngeal exudate or posterior oropharyngeal erythema.  Eyes:     Pupils: Pupils are equal, round, and reactive to light.  Cardiovascular:     Rate and Rhythm: Normal rate and regular rhythm.     Heart sounds: No murmur heard. Pulmonary:     Effort: No respiratory distress.     Breath sounds: Normal breath sounds.  Musculoskeletal:     Cervical back: Normal range of motion and neck supple.  Neurological:     Mental Status: He is alert and oriented to person, place, and time.       Assessment & Plan:   There are no diagnoses linked to this encounter.     I am having 12/30/21 "Joshua Williamson" maintain his amLODipine, omeprazole, and desvenlafaxine.  Allergies as of 04/20/2022       Reactions   Cymbalta [  duloxetine Hcl] Diarrhea        Medication List        Accurate as of April 20, 2022 11:19 AM. If you have any questions, ask your nurse or doctor.          amLODipine 5 MG tablet Commonly known as: NORVASC Take 1 tablet (5 mg total) by mouth daily.   desvenlafaxine 100 MG 24 hr tablet Commonly known as: PRISTIQ Take 1 tablet (100 mg total) by mouth daily.   omeprazole 20 MG capsule Commonly known as: PRILOSEC Take 1 capsule (20 mg total) by mouth daily.         Follow-up: Return in about 6 months (around 10/19/2022).  Mechele Claude, M.D.

## 2022-04-20 NOTE — Progress Notes (Signed)
f °

## 2022-08-04 ENCOUNTER — Other Ambulatory Visit: Payer: Self-pay | Admitting: Family Medicine

## 2022-10-20 ENCOUNTER — Ambulatory Visit: Payer: BC Managed Care – PPO | Admitting: Family Medicine

## 2022-10-21 ENCOUNTER — Encounter: Payer: Self-pay | Admitting: Family Medicine

## 2023-09-16 IMAGING — US US ABDOMEN COMPLETE
1 series · 14 of 25 positions shown · non-contrast
Comparison: None.

CLINICAL DATA: Diffuse abdominal pain.

EXAM:
ABDOMEN ULTRASOUND COMPLETE

[Series 1: us abdomen complete · 14 of 181 slices shown]
[im 1/181]
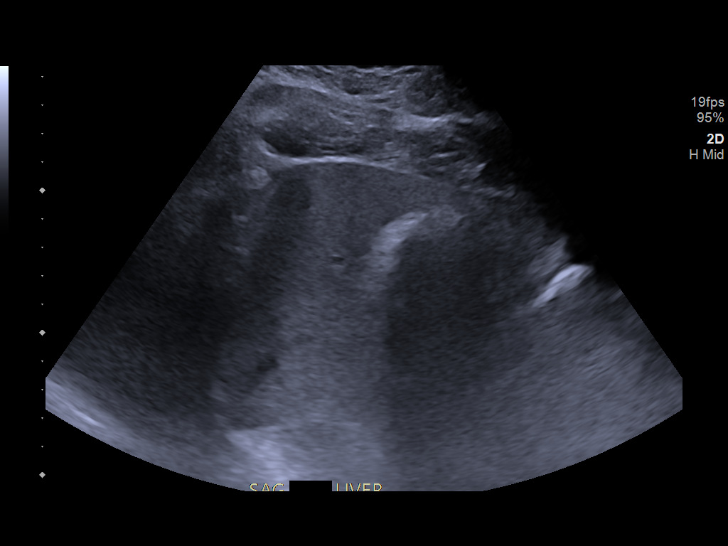
[im 16/181]
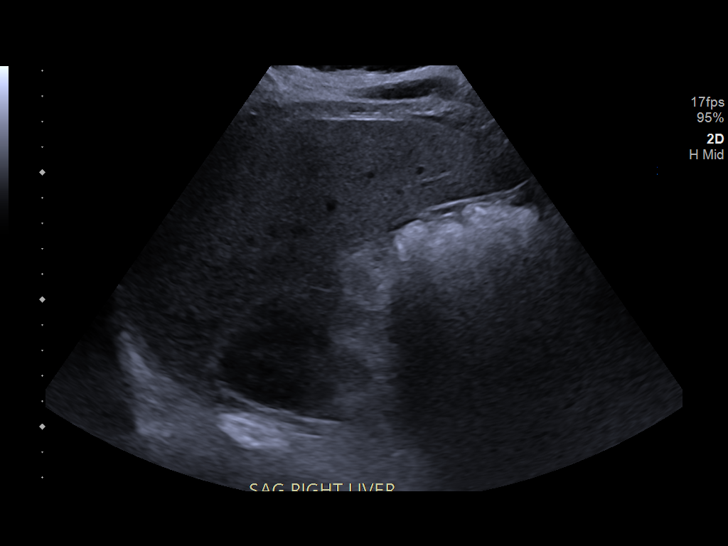
[im 31/181]
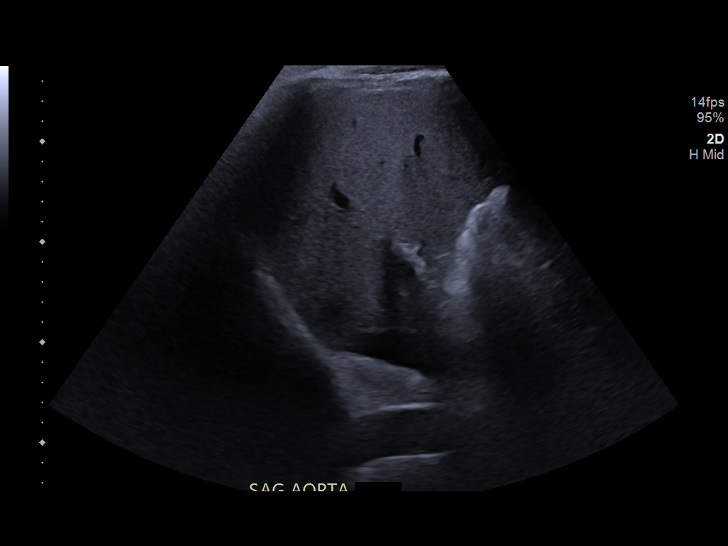
[im 46/181]
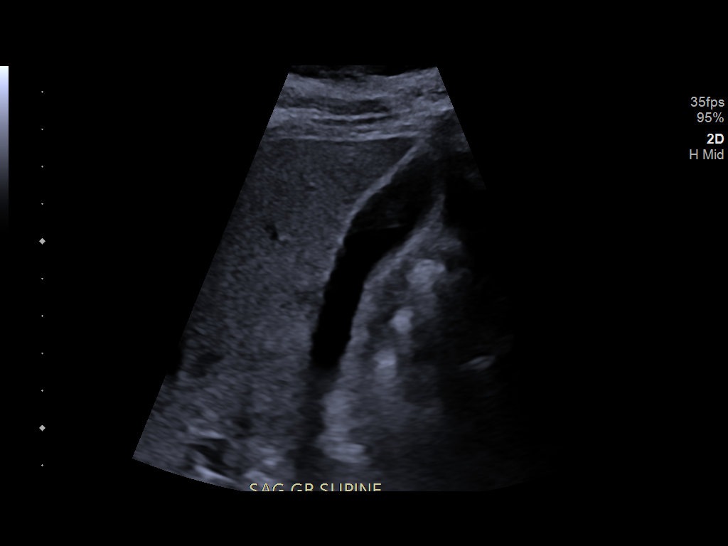
[im 61/181]
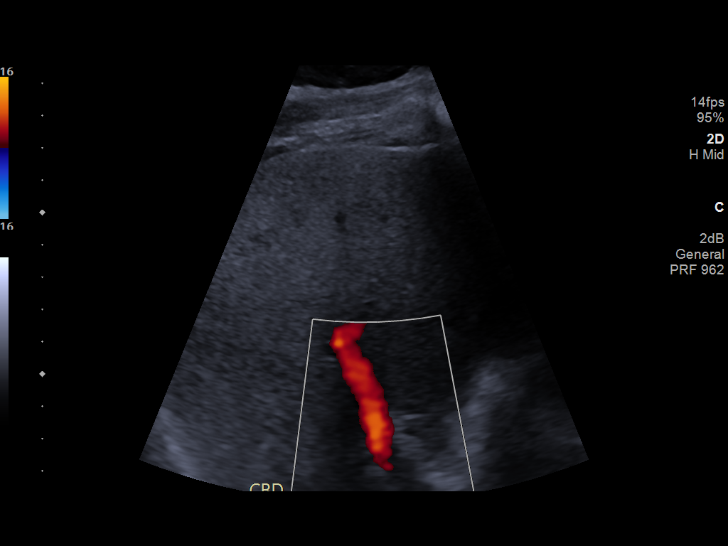
[im 68/181]
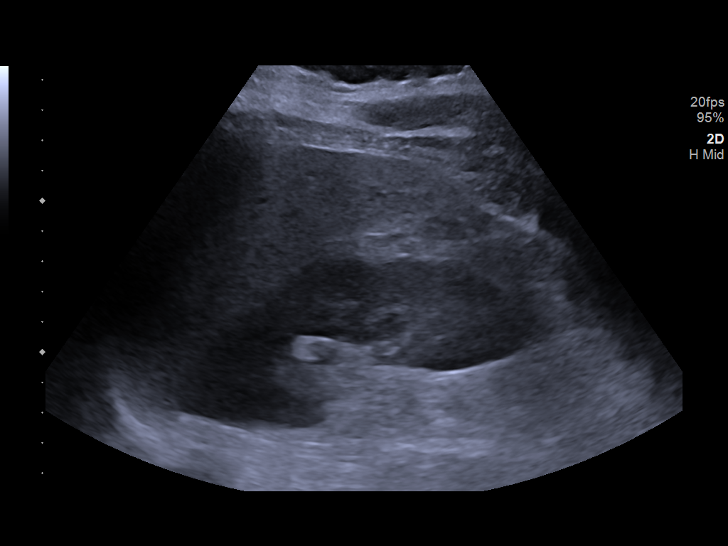
[im 83/181]
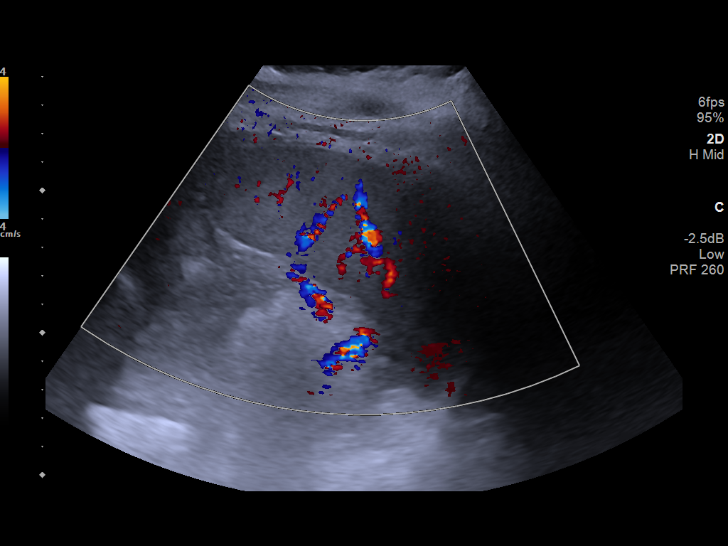
[im 98/181]
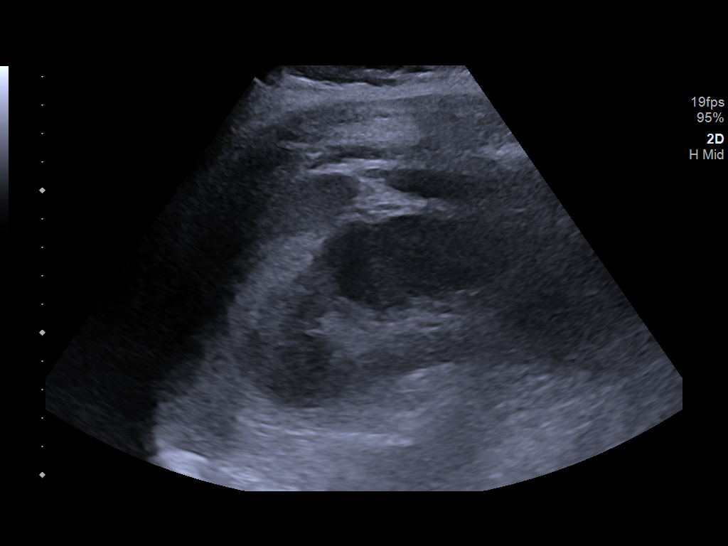
[im 113/181]
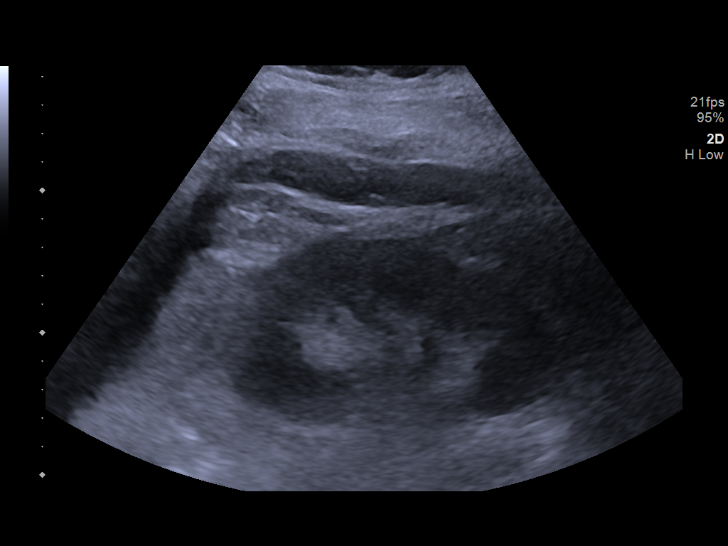
[im 121/181]
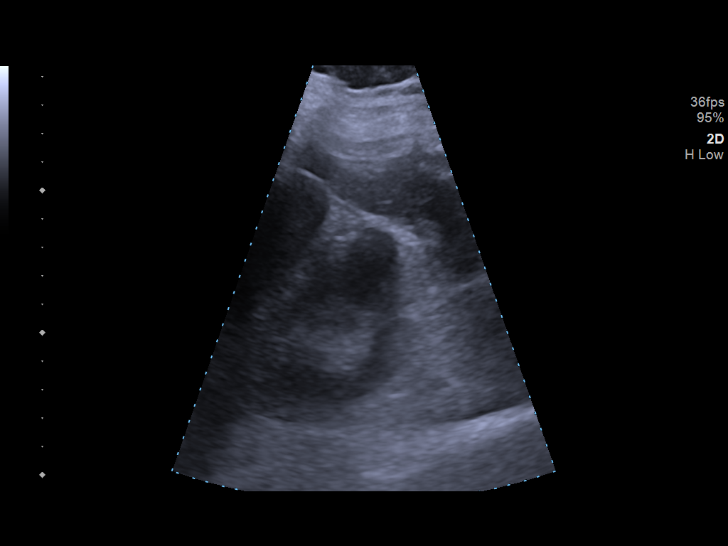
[im 136/181]
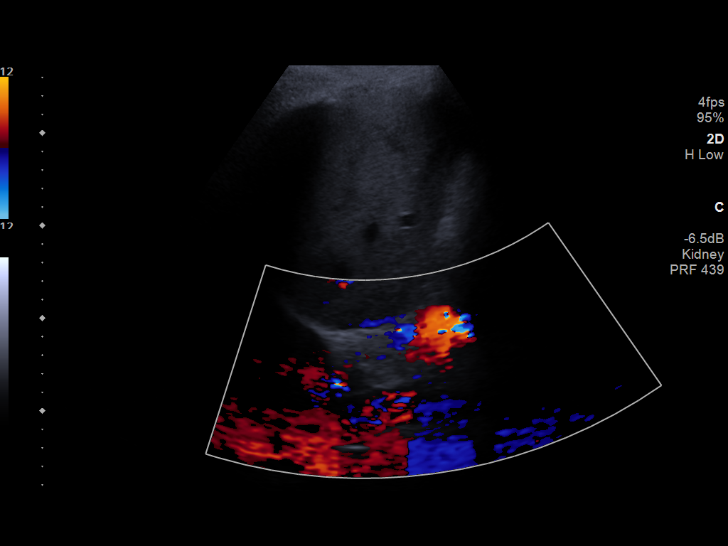
[im 151/181]
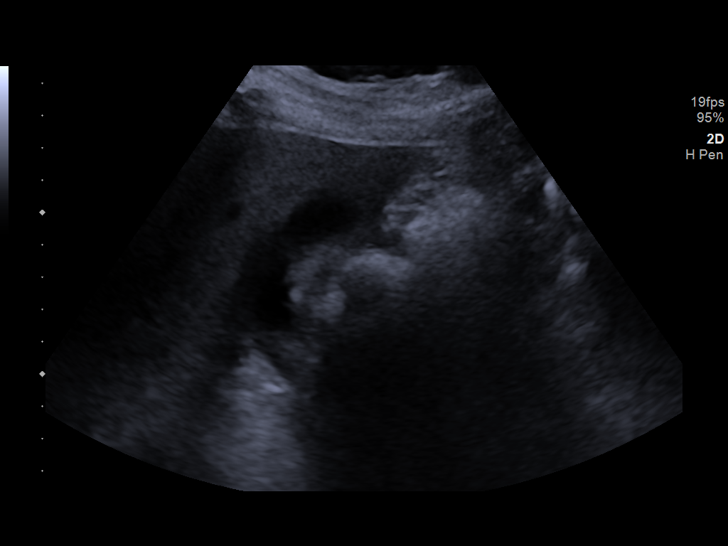
[im 166/181]
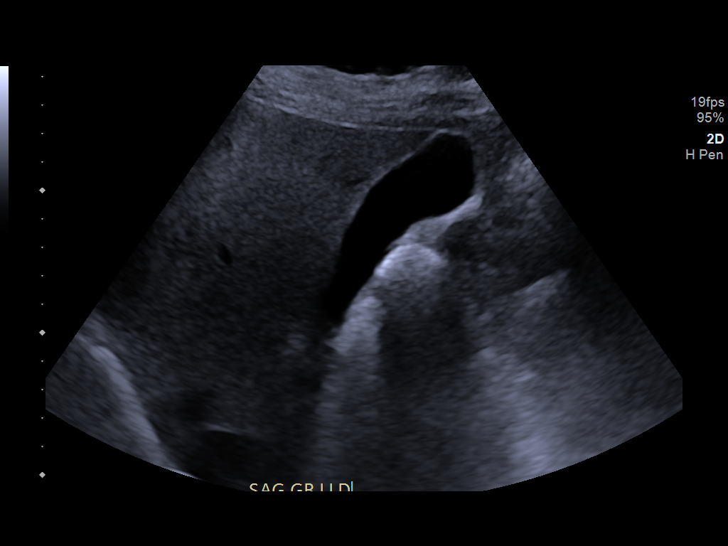
[im 181/181]
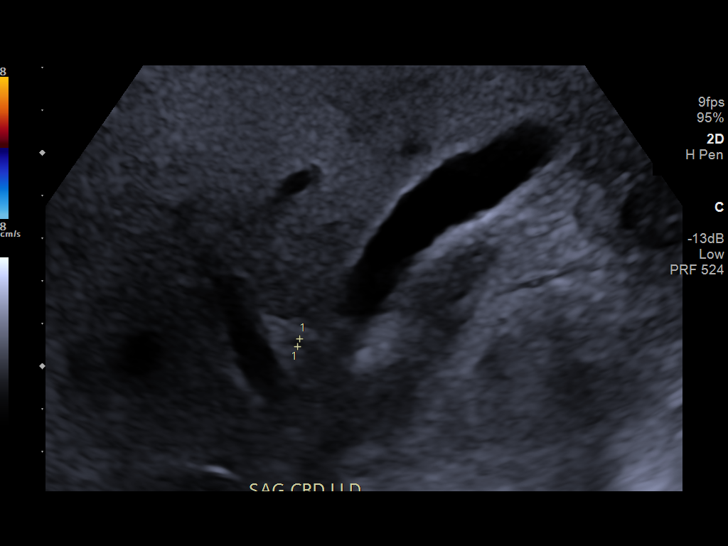

[14 of 25 positions shown; findings below may reference images not displayed]

FINDINGS: Gallbladder: No gallstones or wall thickening visualized. No
sonographic Murphy sign noted by sonographer.

Common bile duct: Diameter: 3.4 mm.

Liver: No focal lesion identified. Increase in parenchymal
echogenicity. Portal vein is patent on color Doppler imaging with
normal direction of blood flow towards the liver.

IVC: No abnormality visualized.

Pancreas: Not well seen secondary overlying bowel gas.

Spleen: Size and appearance within normal limits.  9.5 cm.

Right Kidney: Length: 13.2 cm. Echogenicity within normal limits. No
mass or hydronephrosis visualized.

Left Kidney: Length: 12.8 cm. Echogenicity within normal limits. No
mass or hydronephrosis visualized.

Abdominal aorta: Proximal abdominal aorta measures 3 cm. Distal
abdominal aorta measures 2.3 cm.

Other findings: None.
IMPRESSION: 1. Echogenic liver likely related to fatty infiltration.
2. 3 cm abdominal aortic aneurysm. Recommend follow-up every 3
years.
Reference: [HOSPITAL] 2611;[DATE].
3. Pancreas not well visualized.
